# Patient Record
Sex: Female | Born: 1960 | Race: White | Hispanic: No | Marital: Married
Health system: Southern US, Community
[De-identification: ages and names within clinical notes are randomized; demographics above are authoritative.]

---

## 2012-08-08 ENCOUNTER — Ambulatory Visit: Payer: Self-pay | Admitting: Family Medicine

## 2016-05-04 ENCOUNTER — Other Ambulatory Visit: Payer: Self-pay | Admitting: Physical Medicine and Rehabilitation

## 2016-05-04 DIAGNOSIS — M5416 Radiculopathy, lumbar region: Secondary | ICD-10-CM

## 2016-05-18 ENCOUNTER — Ambulatory Visit
Admission: RE | Admit: 2016-05-18 | Discharge: 2016-05-18 | Disposition: A | Discharge status: Home / Self Care | Payer: Federal, State, Local not specified - PPO | Source: Ambulatory Visit | Attending: Physical Medicine and Rehabilitation | Admitting: Physical Medicine and Rehabilitation

## 2016-05-18 ENCOUNTER — Other Ambulatory Visit: Payer: Self-pay | Admitting: Physical Medicine and Rehabilitation

## 2016-05-18 DIAGNOSIS — T1590XA Foreign body on external eye, part unspecified, unspecified eye, initial encounter: Secondary | ICD-10-CM

## 2016-05-18 DIAGNOSIS — M5136 Other intervertebral disc degeneration, lumbar region: Secondary | ICD-10-CM | POA: Insufficient documentation

## 2016-05-18 DIAGNOSIS — M5416 Radiculopathy, lumbar region: Secondary | ICD-10-CM | POA: Insufficient documentation

## 2016-05-18 DIAGNOSIS — M5126 Other intervertebral disc displacement, lumbar region: Secondary | ICD-10-CM | POA: Diagnosis not present

## 2017-08-24 ENCOUNTER — Other Ambulatory Visit: Payer: Self-pay | Admitting: Obstetrics and Gynecology

## 2017-08-24 DIAGNOSIS — Z1231 Encounter for screening mammogram for malignant neoplasm of breast: Secondary | ICD-10-CM

## 2017-09-07 ENCOUNTER — Ambulatory Visit
Admission: RE | Admit: 2017-09-07 | Discharge: 2017-09-07 | Disposition: A | Discharge status: Home / Self Care | Payer: Federal, State, Local not specified - PPO | Source: Ambulatory Visit | Attending: Obstetrics and Gynecology | Admitting: Obstetrics and Gynecology

## 2017-09-07 DIAGNOSIS — Z1231 Encounter for screening mammogram for malignant neoplasm of breast: Secondary | ICD-10-CM | POA: Insufficient documentation

## 2017-09-08 ENCOUNTER — Other Ambulatory Visit: Payer: Self-pay | Admitting: *Deleted

## 2017-09-08 ENCOUNTER — Inpatient Hospital Stay
Admission: RE | Admit: 2017-09-08 | Discharge: 2017-09-08 | Disposition: A | Discharge status: Home / Self Care | Payer: Self-pay | Source: Ambulatory Visit | Attending: *Deleted | Admitting: *Deleted

## 2017-09-08 DIAGNOSIS — Z9289 Personal history of other medical treatment: Secondary | ICD-10-CM

## 2018-09-04 ENCOUNTER — Other Ambulatory Visit: Payer: Self-pay | Admitting: Obstetrics and Gynecology

## 2018-09-04 DIAGNOSIS — Z1231 Encounter for screening mammogram for malignant neoplasm of breast: Secondary | ICD-10-CM

## 2018-09-12 ENCOUNTER — Ambulatory Visit
Admission: RE | Admit: 2018-09-12 | Discharge: 2018-09-12 | Disposition: A | Discharge status: Home / Self Care | Payer: Federal, State, Local not specified - PPO | Source: Ambulatory Visit | Attending: Obstetrics and Gynecology | Admitting: Obstetrics and Gynecology

## 2018-09-12 DIAGNOSIS — Z1231 Encounter for screening mammogram for malignant neoplasm of breast: Secondary | ICD-10-CM | POA: Insufficient documentation

## 2020-01-03 ENCOUNTER — Ambulatory Visit: Payer: Federal, State, Local not specified - PPO | Attending: Internal Medicine

## 2020-01-03 DIAGNOSIS — Z23 Encounter for immunization: Secondary | ICD-10-CM

## 2020-01-03 NOTE — Progress Notes (Signed)
   Covid-19 Vaccination Clinic  Name:  Joan Miranda    MRN: [id number] DOB: January 15, 1961  01/03/2020  [name] was observed post Covid-19 immunization for 15 minutes without incident. She was provided with Vaccine Information Sheet and instruction to access the V-Safe system.   [name] was instructed to call 911 with any severe reactions post vaccine: Marland Kitchen Difficulty breathing  . Swelling of face and throat  . A fast heartbeat  . A bad rash all over body  . Dizziness and weakness   Immunizations Administered    Name Date Dose VIS Date Route   Pfizer COVID-19 Vaccine 01/03/2020 10:44 AM 0.3 mL 10/04/2019 Intramuscular   Manufacturer: [employer], [employer]   Lot: [id number]   NDC: [address]-8

## 2020-01-28 ENCOUNTER — Ambulatory Visit: Payer: Federal, State, Local not specified - PPO | Attending: Internal Medicine

## 2020-01-28 ENCOUNTER — Other Ambulatory Visit: Payer: Self-pay

## 2020-01-28 DIAGNOSIS — Z23 Encounter for immunization: Secondary | ICD-10-CM

## 2020-01-28 NOTE — Progress Notes (Signed)
   Covid-19 Vaccination Clinic  Name:  Eulla Kochanowski    MRN: [id number] DOB: October 17, 1961  01/28/2020  Ms. Ligman was observed post Covid-19 immunization for 15 minutes without incident. She was provided with Vaccine Information Sheet and instruction to access the V-Safe system.   Ms. Arcos was instructed to call 911 with any severe reactions post vaccine: Marland Kitchen Difficulty breathing  . Swelling of face and throat  . A fast heartbeat  . A bad rash all over body  . Dizziness and weakness   Immunizations Administered    Name Date Dose VIS Date Route   Pfizer COVID-19 Vaccine 01/28/2020  4:32 PM 0.3 mL 10/04/2019 Intramuscular   Manufacturer: [employer], [employer]   Lot: [id number]   NDC: [address]-3

## 2020-03-05 IMAGING — MG DIGITAL SCREENING BILATERAL MAMMOGRAM WITH TOMO AND CAD
6 of 10 series · 6 of 30 positions shown · non-contrast
Comparison: Previous exam(s).

CLINICAL DATA: Screening.

EXAM:
DIGITAL SCREENING BILATERAL MAMMOGRAM WITH TOMO AND CAD

[R CC synth-2D]
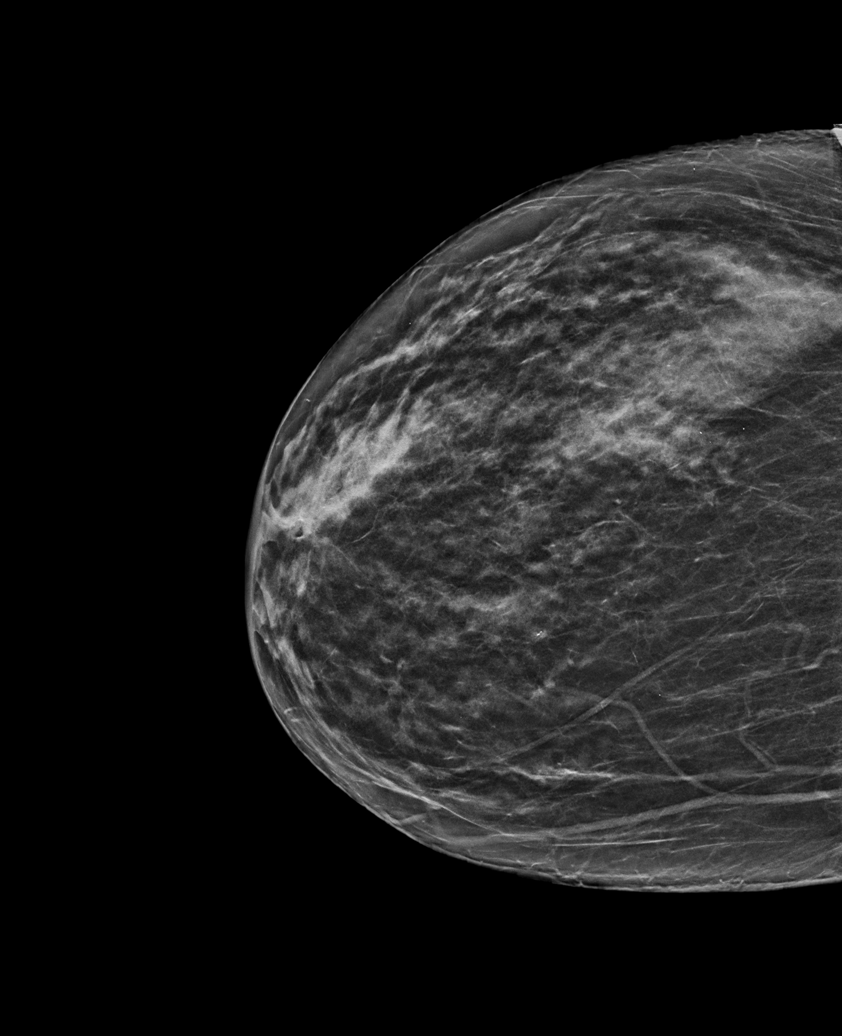

[L CC synth-2D]
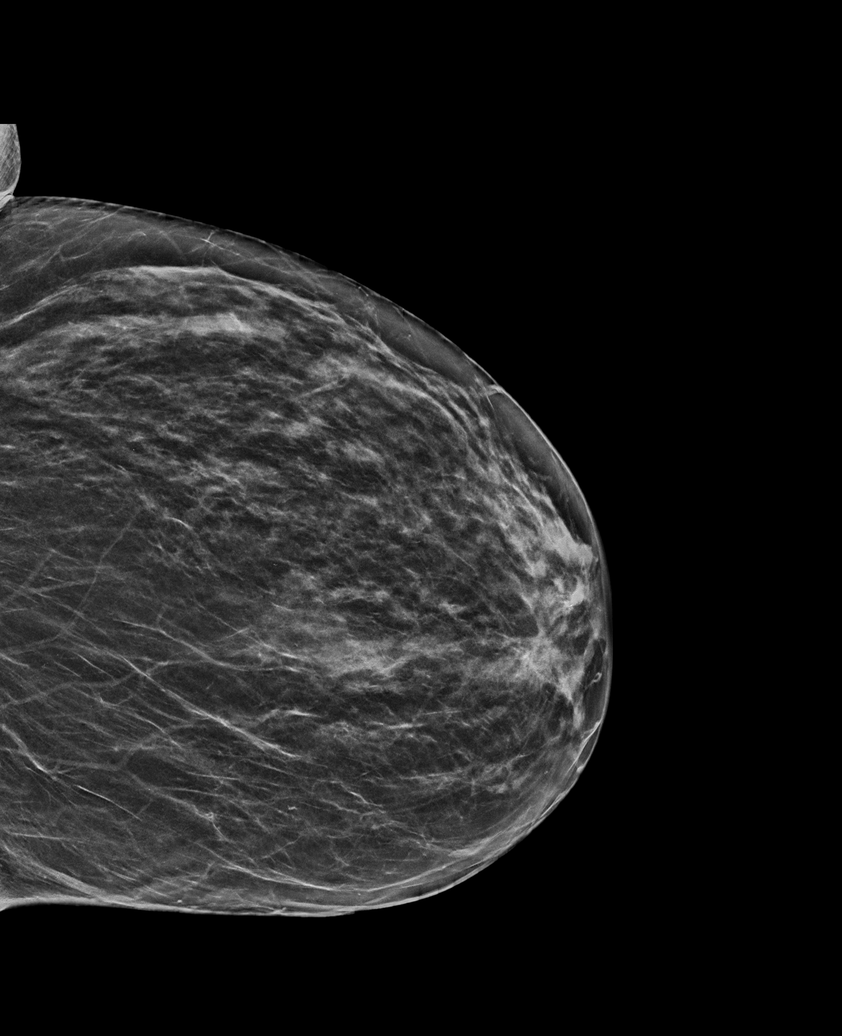

[L MLO synth-2D (1 of 2)]
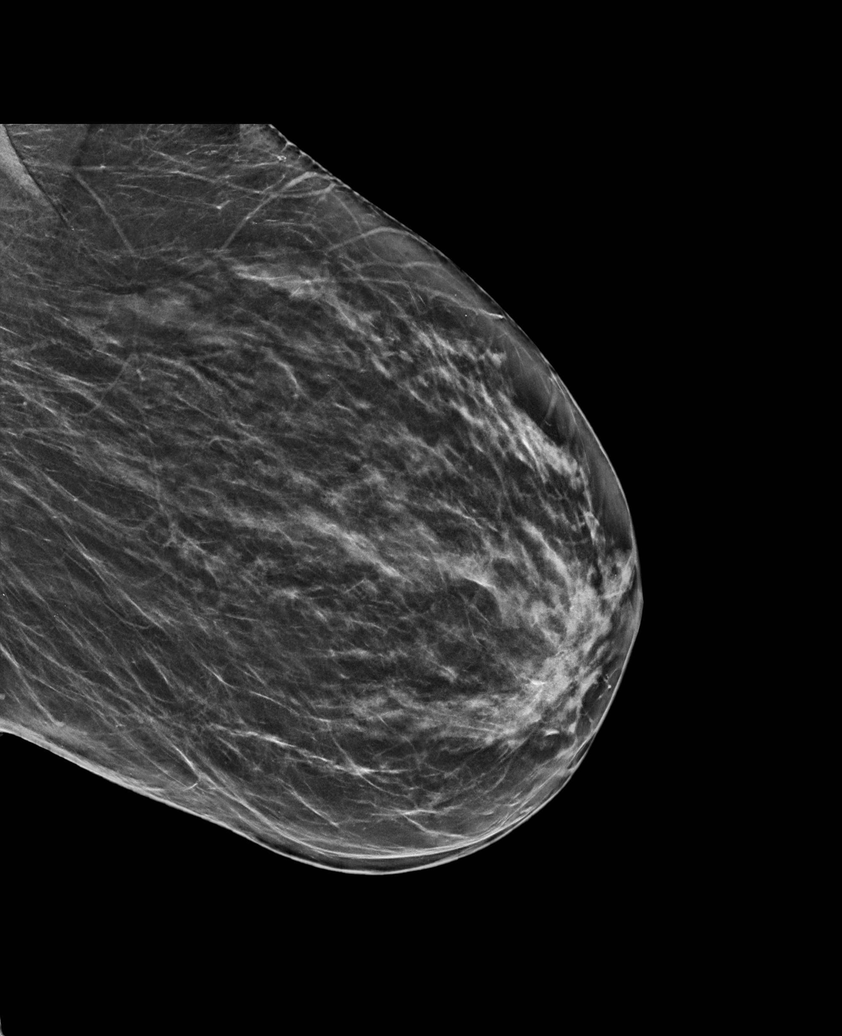

[L MLO synth-2D (2 of 2)]
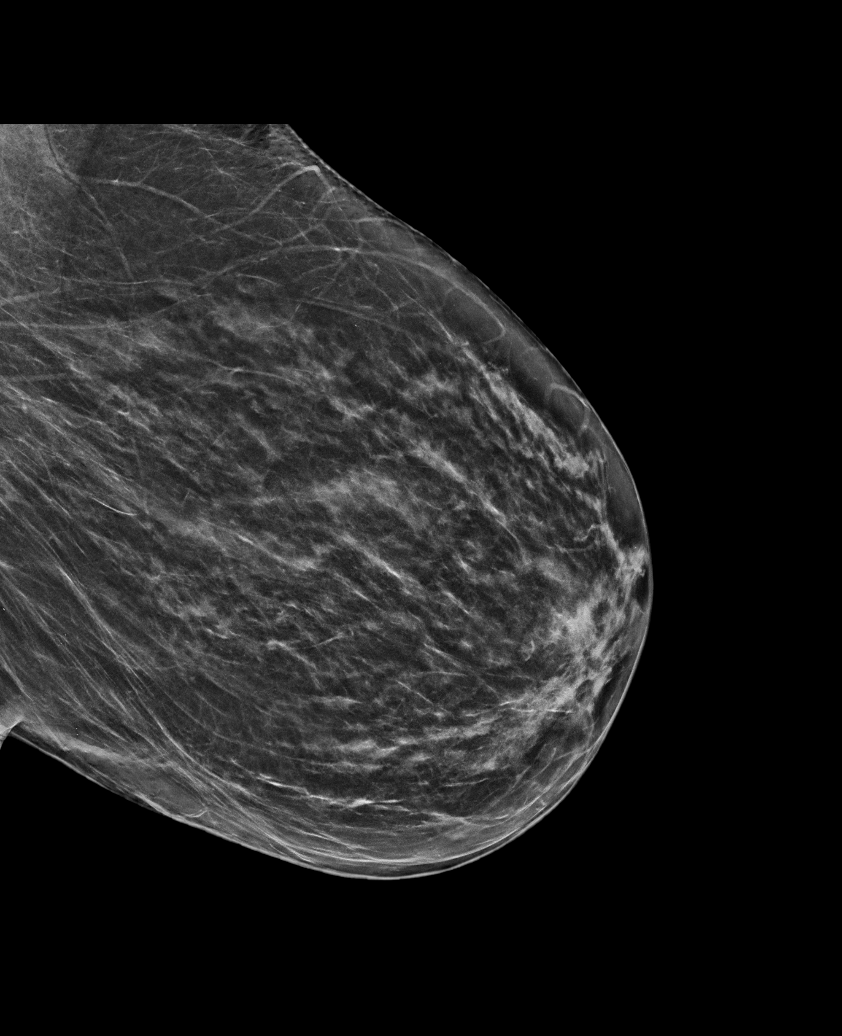

[R MLO synth-2D]
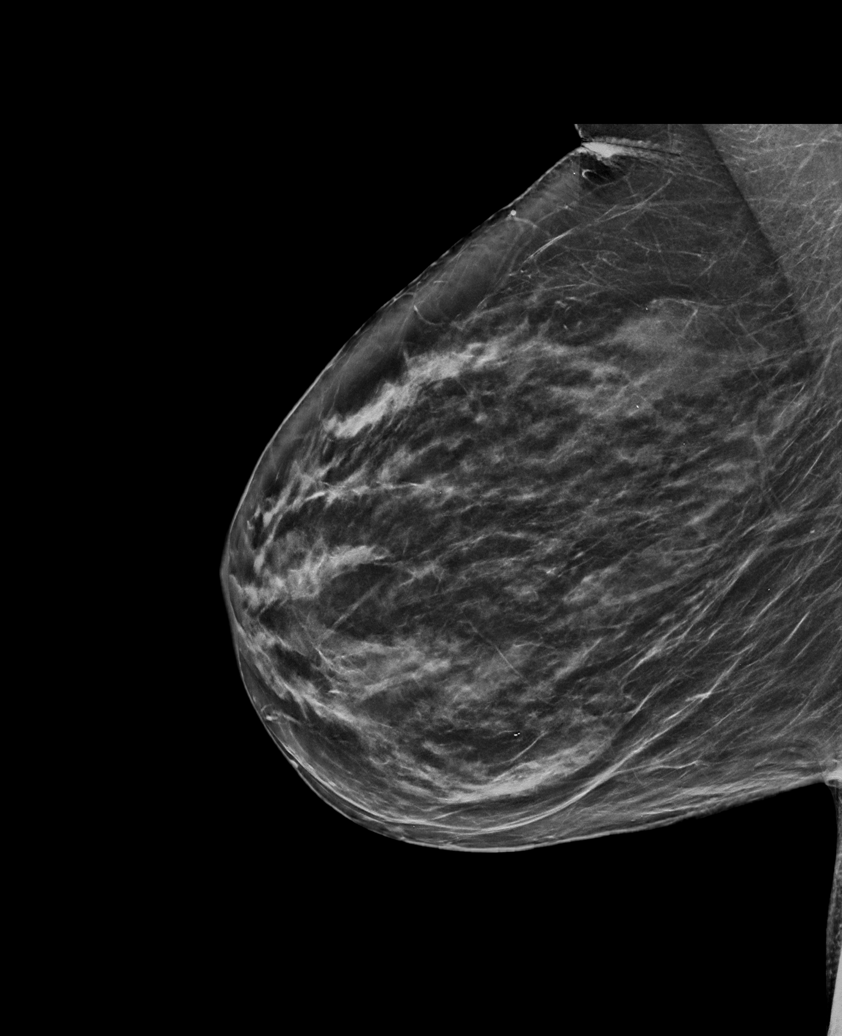

[R MLO tomo · tomo slice 39/76.0]
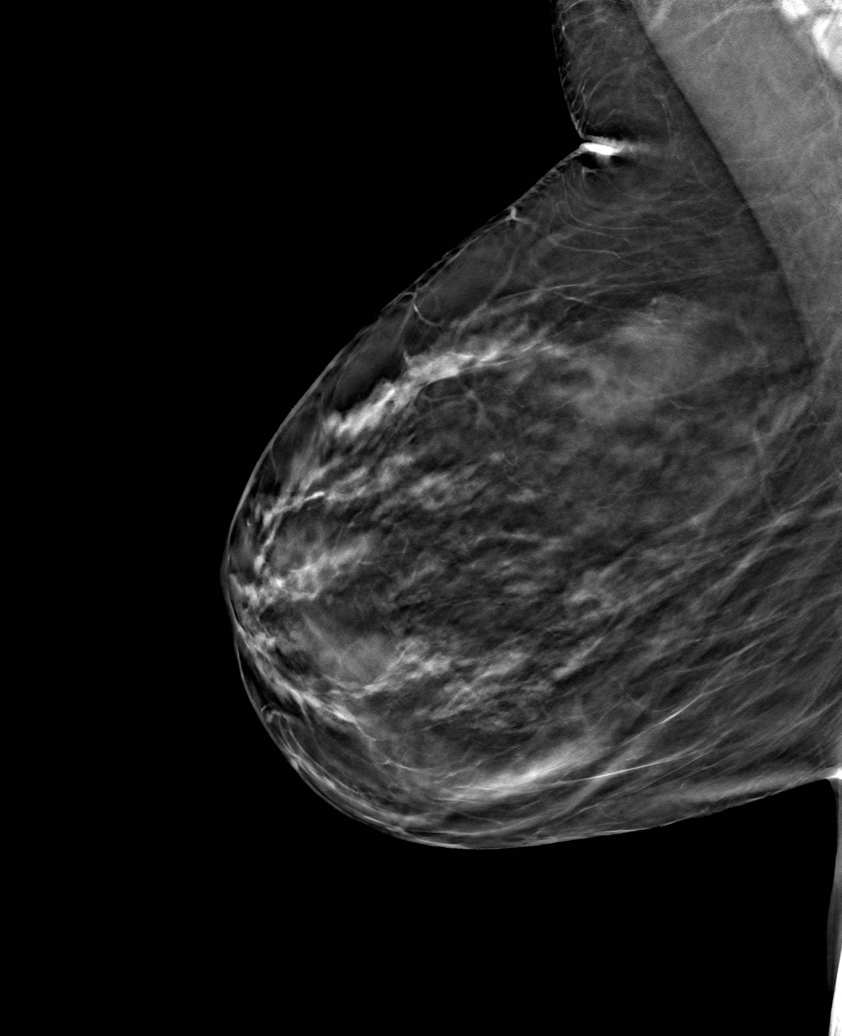

[6 of 30 positions shown; findings below may reference images not displayed]

ACR Breast Density Category c: The breast tissue is heterogeneously
dense, which may obscure small masses.
FINDINGS: There are no findings suspicious for malignancy. Images were
processed with CAD.
IMPRESSION: No mammographic evidence of malignancy. A result letter of this
screening mammogram will be mailed directly to the patient.

RECOMMENDATION:
Screening mammogram in one year. (Code:[id number])

BI-RADS CATEGORY  1: Negative.

## 2021-08-06 ENCOUNTER — Other Ambulatory Visit: Payer: Self-pay | Admitting: Family Medicine

## 2021-08-06 ENCOUNTER — Other Ambulatory Visit: Payer: Self-pay | Admitting: Obstetrics and Gynecology

## 2021-08-06 DIAGNOSIS — Z1231 Encounter for screening mammogram for malignant neoplasm of breast: Secondary | ICD-10-CM

## 2021-08-24 ENCOUNTER — Ambulatory Visit
Admission: RE | Admit: 2021-08-24 | Discharge: 2021-08-24 | Disposition: A | Discharge status: Home / Self Care | Payer: Federal, State, Local not specified - PPO | Source: Ambulatory Visit | Attending: Family Medicine | Admitting: Family Medicine

## 2021-08-24 ENCOUNTER — Other Ambulatory Visit: Payer: Self-pay

## 2021-08-24 DIAGNOSIS — Z1231 Encounter for screening mammogram for malignant neoplasm of breast: Secondary | ICD-10-CM | POA: Insufficient documentation

## 2023-09-08 ENCOUNTER — Other Ambulatory Visit: Payer: Self-pay | Admitting: Family Medicine

## 2023-09-08 DIAGNOSIS — Z1231 Encounter for screening mammogram for malignant neoplasm of breast: Secondary | ICD-10-CM

## 2023-09-28 ENCOUNTER — Ambulatory Visit
Admission: RE | Admit: 2023-09-28 | Discharge: 2023-09-28 | Disposition: A | Discharge status: Home / Self Care | Payer: Federal, State, Local not specified - PPO | Source: Ambulatory Visit | Attending: Family Medicine | Admitting: Family Medicine

## 2023-09-28 DIAGNOSIS — Z1231 Encounter for screening mammogram for malignant neoplasm of breast: Secondary | ICD-10-CM | POA: Diagnosis present

## 2024-02-29 ENCOUNTER — Encounter: Payer: Self-pay | Admitting: Obstetrics and Gynecology

## 2024-03-01 ENCOUNTER — Other Ambulatory Visit: Payer: Self-pay | Admitting: Obstetrics and Gynecology

## 2024-03-01 DIAGNOSIS — Z13820 Encounter for screening for osteoporosis: Secondary | ICD-10-CM
